# Patient Record
Sex: Male | Born: 1990 | Race: Black or African American | Hispanic: No | Marital: Single | Smoking: Never smoker
Health system: Southern US, Community
[De-identification: ages and names within clinical notes are randomized; demographics above are authoritative.]

---

## 2019-08-25 ENCOUNTER — Other Ambulatory Visit: Payer: Self-pay

## 2019-08-25 ENCOUNTER — Encounter: Payer: Self-pay | Admitting: Emergency Medicine

## 2019-08-25 ENCOUNTER — Emergency Department
Admission: EM | Admit: 2019-08-25 | Discharge: 2019-08-25 | Disposition: A | Discharge status: Home / Self Care | Payer: Self-pay | Attending: Emergency Medicine | Admitting: Emergency Medicine

## 2019-08-25 DIAGNOSIS — H1031 Unspecified acute conjunctivitis, right eye: Secondary | ICD-10-CM | POA: Insufficient documentation

## 2019-08-25 DIAGNOSIS — H5789 Other specified disorders of eye and adnexa: Secondary | ICD-10-CM | POA: Insufficient documentation

## 2019-08-25 MED ORDER — TOBRAMYCIN 0.3 % OP SOLN: 1.0000 [drp] | OPHTHALMIC | 0 refills | Status: AC

## 2019-08-25 MED ORDER — EYE WASH OPHTH SOLN
1.0000 [drp] | OPHTHALMIC | Status: DC | PRN
  Filled 2019-08-25: qty 118

## 2019-08-25 MED ORDER — TETRACAINE HCL 0.5 % OP SOLN
1.0000 [drp] | Freq: Once | OPHTHALMIC | Status: AC
  Administered 2019-08-25: 2 [drp] via OPHTHALMIC
  Filled 2019-08-25: qty 4

## 2019-08-25 MED ORDER — FLUORESCEIN SODIUM 1 MG OP STRP
1.0000 | ORAL_STRIP | Freq: Once | OPHTHALMIC | Status: AC
  Administered 2019-08-25: 1 via OPHTHALMIC
  Filled 2019-08-25: qty 1
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
## 2021-03-22 ENCOUNTER — Emergency Department: Payer: Self-pay

## 2021-03-22 ENCOUNTER — Emergency Department
Admission: EM | Admit: 2021-03-22 | Discharge: 2021-03-23 | Disposition: A | Discharge status: Home / Self Care | Payer: Self-pay | Attending: Emergency Medicine | Admitting: Emergency Medicine

## 2021-03-22 ENCOUNTER — Encounter: Payer: Self-pay | Admitting: Emergency Medicine

## 2021-03-22 ENCOUNTER — Other Ambulatory Visit: Payer: Self-pay

## 2021-03-22 DIAGNOSIS — R5081 Fever presenting with conditions classified elsewhere: Secondary | ICD-10-CM

## 2021-03-22 DIAGNOSIS — M545 Low back pain, unspecified: Secondary | ICD-10-CM | POA: Insufficient documentation

## 2021-03-22 DIAGNOSIS — R509 Fever, unspecified: Secondary | ICD-10-CM | POA: Insufficient documentation

## 2021-03-22 DIAGNOSIS — R4182 Altered mental status, unspecified: Secondary | ICD-10-CM | POA: Insufficient documentation

## 2021-03-22 DIAGNOSIS — M546 Pain in thoracic spine: Secondary | ICD-10-CM | POA: Insufficient documentation

## 2021-03-22 LAB — URINALYSIS, COMPLETE (UACMP) WITH MICROSCOPIC
Bacteria, UA: NONE SEEN
Bilirubin Urine: NEGATIVE
Glucose, UA: NEGATIVE mg/dL
Ketones, ur: 20 mg/dL — AB
Leukocytes,Ua: NEGATIVE
Nitrite: NEGATIVE
Protein, ur: NEGATIVE mg/dL
Specific Gravity, Urine: 1.019 (ref 1.005–1.030)
pH: 5 (ref 5.0–8.0)

## 2021-03-22 LAB — COMPREHENSIVE METABOLIC PANEL
ALT: 37 U/L (ref 0–44)
AST: 36 U/L (ref 15–41)
Albumin: 4.5 g/dL (ref 3.5–5.0)
Alkaline Phosphatase: 38 U/L (ref 38–126)
Anion gap: 10 (ref 5–15)
BUN: 14 mg/dL (ref 6–20)
CO2: 23 mmol/L (ref 22–32)
Calcium: 9 mg/dL (ref 8.9–10.3)
Chloride: 104 mmol/L (ref 98–111)
Creatinine, Ser: 1.29 mg/dL — ABNORMAL HIGH (ref 0.61–1.24)
GFR, Estimated: >60 mL/min (ref >60)
Glucose, Bld: 89 mg/dL (ref 70–99)
Potassium: 3.7 mmol/L (ref 3.5–5.1)
Sodium: 137 mmol/L (ref 135–145)
Total Bilirubin: 1.6 mg/dL — ABNORMAL HIGH (ref 0.3–1.2)
Total Protein: 7.7 g/dL (ref 6.5–8.1)

## 2021-03-22 LAB — CBC WITH DIFFERENTIAL/PLATELET
Abs Immature Granulocytes: 0.01 10*3/uL (ref 0.00–0.07)
Basophils Absolute: 0 10*3/uL (ref 0.0–0.1)
Basophils Relative: 1 %
Eosinophils Absolute: 0 10*3/uL (ref 0.0–0.5)
Eosinophils Relative: 1 %
HCT: 46.5 % (ref 39.0–52.0)
Hemoglobin: 16.2 g/dL (ref 13.0–17.0)
Immature Granulocytes: 0 %
Lymphocytes Relative: 16 %
Lymphs Abs: 0.6 10*3/uL — ABNORMAL LOW (ref 0.7–4.0)
MCH: 31.2 pg (ref 26.0–34.0)
MCHC: 34.8 g/dL (ref 30.0–36.0)
MCV: 89.4 fL (ref 80.0–100.0)
Monocytes Absolute: 0.7 10*3/uL (ref 0.1–1.0)
Monocytes Relative: 19 %
Neutro Abs: 2.4 10*3/uL (ref 1.7–7.7)
Neutrophils Relative %: 63 %
Platelets: 177 10*3/uL (ref 150–400)
RBC: 5.2 MIL/uL (ref 4.22–5.81)
RDW: 13.5 % (ref 11.5–15.5)
WBC: 3.8 10*3/uL — ABNORMAL LOW (ref 4.0–10.5)
nRBC: 0 % (ref 0.0–0.2)

## 2021-03-22 LAB — URINE DRUG SCREEN, QUALITATIVE (ARMC ONLY)
Amphetamines, Ur Screen: NOT DETECTED
Barbiturates, Ur Screen: NOT DETECTED
Benzodiazepine, Ur Scrn: NOT DETECTED
Cannabinoid 50 Ng, Ur ~~LOC~~: POSITIVE — AB
Cocaine Metabolite,Ur ~~LOC~~: NOT DETECTED
MDMA (Ecstasy)Ur Screen: NOT DETECTED
Methadone Scn, Ur: NOT DETECTED
Opiate, Ur Screen: NOT DETECTED
Phencyclidine (PCP) Ur S: NOT DETECTED
Tricyclic, Ur Screen: NOT DETECTED

## 2021-03-22 LAB — TROPONIN I (HIGH SENSITIVITY)
Troponin I (High Sensitivity): 2 ng/L (ref <18)
Troponin I (High Sensitivity): 3 ng/L (ref <18)

## 2021-03-22 LAB — MAGNESIUM: Magnesium: 1.9 mg/dL (ref 1.7–2.4)

## 2021-03-22 LAB — D-DIMER, QUANTITATIVE: D-Dimer, Quant: 0.75 ug/mL-FEU — ABNORMAL HIGH (ref 0.00–0.50)

## 2021-03-22 LAB — LACTIC ACID, PLASMA: Lactic Acid, Venous: 1.4 mmol/L (ref 0.5–1.9)

## 2021-03-22 MED ORDER — LACTATED RINGERS IV BOLUS
1000.0000 mL | Freq: Once | INTRAVENOUS | Status: AC
  Administered 2021-03-22: 1000 mL via INTRAVENOUS

## 2021-03-22 MED ORDER — GADOBUTROL 1 MMOL/ML IV SOLN
8.0000 mL | Freq: Once | INTRAVENOUS | Status: AC | PRN
  Administered 2021-03-22: 8 mL via INTRAVENOUS

## 2021-03-22 MED ORDER — ACETAMINOPHEN 500 MG PO TABS
1000.0000 mg | ORAL_TABLET | Freq: Once | ORAL | Status: AC
  Administered 2021-03-22: 1000 mg via ORAL
  Filled 2021-03-22: qty 2

## 2021-03-22 MED ORDER — KETOROLAC TROMETHAMINE 30 MG/ML IJ SOLN
15.0000 mg | Freq: Once | INTRAMUSCULAR | Status: AC
  Administered 2021-03-22: 15 mg via INTRAVENOUS
  Filled 2021-03-22: qty 1

## 2021-03-22 NOTE — ED Triage Notes (Signed)
Pt to ED via EMS from home. Per EMS, pt was alert to pain when they arrived on scene. Pt has not spoken as much. EMS gave pt ammonia to smell and that woke him up. Pt has temp 101.6 axillary.  Upon arrival pt is a/o x 4 and speaking in complete sentences with MD. Pt stated that he had taken some cough medicine and complains of back pain.

## 2021-03-22 NOTE — ED Provider Notes (Signed)
[facility] Emergency Department Provider Note ____________________________________________   Event Date/Time   First MD Initiated Contact with Patient 03/22/21 1708     (approximate)  I have reviewed the triage vital signs and the nursing notes.  HISTORY  Chief Complaint Altered Mental Status   HPI Alex Wu is a 30 y.o. malewho presents to the ED for evaluation of atraumatic thoracic back pain and altered mentation.  Chart review indicates no history in our system.  Patient presents to the ED via EMS from home due to altered mentation.  EMS was called by patient's mother or girlfriend to his home, found him in his room on his bed unresponsive with a bottle of NyQuil next to his bed.  Only small amount of this bottle was missing and there was no further paraphernalia or oddities to the scene.  Patient unresponsive and required ammonia salts to wake up, and has been awake since that time.  Patient reports atraumatic lumbar pain and fever for the past 24 hours.  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  Denies known fever, but is noted to have 101 F fever with EMS, and 103.4 F here. Patient indicates occasional cannabis use, but adamantly denies any IVDU.    Patient reports using 1 dose of NyQuil at home to help with his pain due to difficulty sleeping, without significant improvement.  Currently reporting 10/10 atraumatic lumbar pain to his upper lumbar back. Denies dysuria, chest pain, syncopal episodes, shortness of breath, emesis  No past medical history on file.  There are no problems to display for this patient.   No past surgical history on file.  Prior to Admission medications   Not on File    Allergies Patient has no known allergies.  No family history on file.  Social History Social  History   Tobacco Use  . Smoking status: Never Smoker  . Smokeless tobacco: Never Used  Substance Use Topics  . Alcohol use: Not Currently  . Drug use: Yes    Types: Marijuana    Comment: Pt stated he smokes everyday    Review of Systems  Constitutional: No fever/chills Eyes: No visual changes. ENT: No sore throat. Cardiovascular: Denies chest pain. Respiratory: Denies shortness of breath. Gastrointestinal: No abdominal pain.  No nausea, no vomiting.  No diarrhea.  No constipation. Genitourinary: Negative for dysuria. Musculoskeletal: Positive for atraumatic upper lumbar pain. Skin: Negative for rash. Neurological: Negative for headaches, focal weakness or numbness.  ____________________________________________   PHYSICAL EXAM:  VITAL SIGNS: Vitals:   03/22/21 2030 03/22/21 2100  BP: 116/79 119/78  Pulse: 85 75  Resp: (!) 22 (!) 22  Temp:    SpO2: 95% 95%     Constitutional: Alert and oriented. Well appearing and in no acute distress.  Supine in bed, slow to respond, but answers all questions appropriately. Able to independently roll on his side so I can examine his back. Eyes: Conjunctivae are normal. PERRL. EOMI. Head: Atraumatic. Nose: No congestion/rhinnorhea. Mouth/Throat: Mucous membranes are moist.  Oropharynx non-erythematous. Neck: No stridor. No cervical spine tenderness to palpation. Cardiovascular: Normal rate, regular rhythm. Grossly normal heart sounds.  Good peripheral circulation. Respiratory: Normal respiratory effort.  No retractions. Lungs CTAB. Gastrointestinal: Soft , nondistended, nontender to palpation. No CVA tenderness. Musculoskeletal: No lower extremity tenderness nor edema.  No joint effusions. No signs of acute  trauma. Upper lumbar pain around L1/L2 that is poorly localizing.  No signs of trauma to the back or bony step-offs.  Some tenderness paraspinally, but primarily around midline. Neurologic:  Normal speech and language. No gross  focal neurologic deficits are appreciated.  Cranial nerves II through XII intact 5/5 strength and sensation in all 4 extremities Skin:  Skin is warm, dry and intact. No rash noted. Psychiatric: Mood and affect are normal. Speech and behavior are normal.  ____________________________________________   LABS (all labs ordered are listed, but only abnormal results are displayed)  Labs Reviewed  CBC WITH DIFFERENTIAL/PLATELET - Abnormal; Notable for the following components:      Result Value   WBC 3.8 (*)    Lymphs Abs 0.6 (*)    All other components within normal limits  URINALYSIS, COMPLETE (UACMP) WITH MICROSCOPIC - Abnormal; Notable for the following components:   Color, Urine YELLOW (*)    APPearance CLEAR (*)    Hgb urine dipstick SMALL (*)    Ketones, ur 20 (*)    All other components within normal limits  URINE DRUG SCREEN, QUALITATIVE ([facility] ONLY) - Abnormal; Notable for the following components:   Cannabinoid 50 Ng, Ur [place] POSITIVE (*)    All other components within normal limits  COMPREHENSIVE METABOLIC PANEL - Abnormal; Notable for the following components:   Creatinine, Ser 1.29 (*)    Total Bilirubin 1.6 (*)    All other components within normal limits  D-DIMER, QUANTITATIVE (NOT AT [facility]) - Abnormal; Notable for the following components:   D-Dimer, Quant 0.75 (*)    All other components within normal limits  CULTURE, BLOOD (SINGLE)  LACTIC ACID, PLASMA  MAGNESIUM  LACTIC ACID, PLASMA  TROPONIN I (HIGH SENSITIVITY)  TROPONIN I (HIGH SENSITIVITY)   ____________________________________________  12 Lead EKG  Sinus rhythm, rate of 97 bpm.  Normal axis and intervals.  Inferior T wave inversions without STEMI criteria.  Some stigmata of LVH ____________________________________________  RADIOLOGY  ED MD interpretation: 1 view CXR reviewed by me without evidence of acute cardiopulmonary pathology.  Official radiology report(s):  DG Chest Portable 1 View  Result  Date: 03/22/2021 CLINICAL DATA:  Altered mental status, fever and pleuritic pain. EXAM: PORTABLE CHEST 1 VIEW COMPARISON:  None. FINDINGS: Lungs clear. Heart size normal. No pneumothorax or pleural fluid. No bony abnormality. IMPRESSION: Normal chest. Electronically Signed   By: Drusilla Kanner M.D.   On: 03/22/2021 17:36    ____________________________________________   PROCEDURES and INTERVENTIONS  Procedure(s) performed (including Critical Care):  .1-3 Lead EKG Interpretation Performed by: Delton Prairie, MD Authorized by: Delton Prairie, MD     Interpretation: normal     ECG rate:  74   ECG rate assessment: normal     Rhythm: sinus rhythm     Ectopy: none     Conduction: normal      Medications  lactated ringers bolus 1,000 mL (1,000 mLs Intravenous New Bag/Given 03/22/21 1721)  acetaminophen (TYLENOL) tablet 1,000 mg (1,000 mg Oral Given 03/22/21 1940)  ketorolac (TORADOL) 30 MG/ML injection 15 mg (15 mg Intravenous Given 03/22/21 1939)  gadobutrol (GADAVIST) 1 MMOL/ML injection 8 mL (8 mLs Intravenous Contrast Given 03/22/21 2257)    ____________________________________________   MDM / ED COURSE   Otherwise healthy 30 year old male presents to the ED with atraumatic back pain and fever of uncertain etiology.  Febrile at 103.4 F, but hemodynamically stable.  Only complaint is back pain, he did not even know that he had a  fever.  Reports pain started last night in an atraumatic fashion and has been progressively worsening.  Adamantly denies any IVDU and only indicates cannabis ingestion.  Blood work is reassuring without evidence of sepsis or severe metabolic derangements.  CXR without infiltrate.  Provided antipyretics and pursued MRI lumbar spine to evaluate for spinal epidural abscess.  No neurologic or vascular deficits.  Patient signed out to oncoming provider pending read of this MRI.  Clinical Course as of 03/22/21 2323  Wed Mar 22, 2021  1906 Reassessed.  Patient  continues to have significant tenderness to his upper lumbar back.  We discussed fever and back pain and is concerning for possible spinal pathology, such as spinal epidural abscess.  He continues to deny IVDU and only indicates he smokes cannabis.  I recommend MRI of his lumbar back and he is agreeable. [DS]  2322 Patient signed out to oncoming provider pending MRI lumbar spine read.  We discussed CTA imaging if this is negative.  [DS]    Clinical Course User Index [DS] Delton Prairie, MD    ____________________________________________   FINAL CLINICAL IMPRESSION(S) / ED DIAGNOSES  Final diagnoses:  Fever in other diseases  Acute midline low back pain without sciatica     ED Discharge Orders    None       Leota Maka   Note:  This document was prepared using Dragon voice recognition software and may include unintentional dictation errors.   Delton Prairie, MD 03/22/21 [phone]

## 2021-03-23 LAB — LACTIC ACID, PLASMA: Lactic Acid, Venous: 0.9 mmol/L (ref 0.5–1.9)

## 2021-03-23 LAB — SALICYLATE LEVEL: Salicylate Lvl: <7 mg/dL — ABNORMAL LOW (ref 7.0–30.0)

## 2021-03-23 LAB — ACETAMINOPHEN LEVEL: Acetaminophen (Tylenol), Serum: <10 ug/mL — ABNORMAL LOW (ref 10–30)

## 2021-03-23 MED ORDER — IOHEXOL 350 MG/ML SOLN
75.0000 mL | Freq: Once | INTRAVENOUS | Status: AC | PRN
  Administered 2021-03-23: 75 mL via INTRAVENOUS

## 2021-03-23 NOTE — ED Provider Notes (Signed)
Accepted care of this patient from Dr. Adaline Sill at 11 PM pending CT.  This is an otherwise healthy 30 year old male who presented for evaluation of fever and confusion.  Patient was also complaining of back pain.  Upon my reevaluation patient reports that he has had a history of back pain in the past due to his job.  He reports that he carries heavy things and walks up and down the stairs several times a day for work.  He reports that the pain was worse yesterday.  He was found with a bottle of NyQuil next to him.  He does not even remember taking any NyQuil.  He is diffusely tender on the upper lumbar area including bilateral paraspinal spaces.  I reviewed the MRI with the radiologist Dr. Deatra Robinson over the phone and there is no abnormality seen.  Patient was then sent for CT chest abdomen pelvis which were negative for PE, negative for any intra-abdominal abnormalities.  Also reviewed the CT with Dr. Alcide Clever from radiology who does not see any abnormalities of the spine or any other abnormalities on patient's CT.   I did add a salicylate and acetaminophen level due to concerns of possible NyQuil overdose.  They were both undetectable.  Lactic is negative.  Mild leukopenia with a white count of 3.8 with low lymphocytes.  Possibly a viral syndrome causing the fever.  His UA is negative for urinary tract infection.  Patient received Tylenol, Toradol and fluids per Dr. Katrinka Blazing.  He feels markedly improved.  He is here with his significant other who says patient is at baseline at this time.  At this point with a negative work-up patient is stable to discharge home on supportive care.  Discussed my standard return precautions and close follow-up with PCP   Nita Sickle, MD 03/23/21 [phone]

## 2021-03-27 LAB — CULTURE, BLOOD (SINGLE): Culture: NO GROWTH

## 2021-09-04 IMAGING — CT CT ABD-PELV W/ CM
2 of 4 series · 15 of 46 positions shown, 17 images · IV contrast (APPLIED)
Comparison: None.

CLINICAL DATA: Found unresponsive

EXAM:
CT ANGIOGRAPHY CHEST
CT ABDOMEN AND PELVIS WITH CONTRAST
TECHNIQUE: Multidetector CT imaging of the chest was performed using the
standard protocol during bolus administration of intravenous
contrast. Multiplanar CT image reconstructions and MIPs were
obtained to evaluate the vascular anatomy. Multidetector CT imaging
of the abdomen and pelvis was performed using the standard protocol
during bolus administration of intravenous contrast.
CONTRAST:  75mL OMNIPAQUE IOHEXOL 350 MG/ML SOLN

[Series 2: axial st · axial · 0.79mm/px · z∈[-668,-208]mm · 12 of 110 slices shown, 14 images]
[im 9/110  soft-tissue]
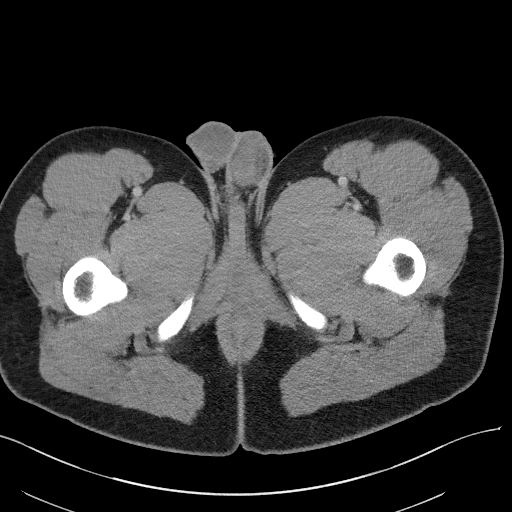
[im 9/110  bone]
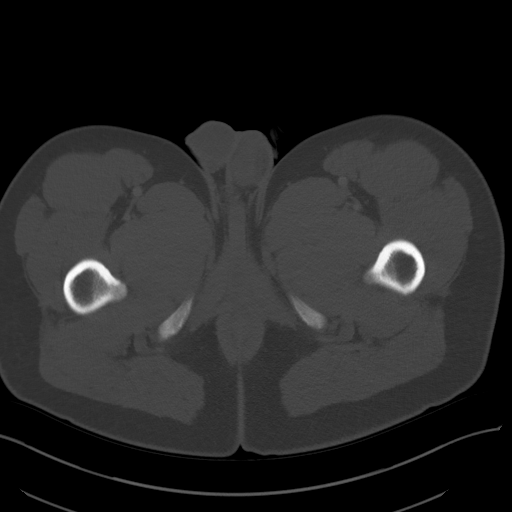
[im 17/110  soft-tissue]
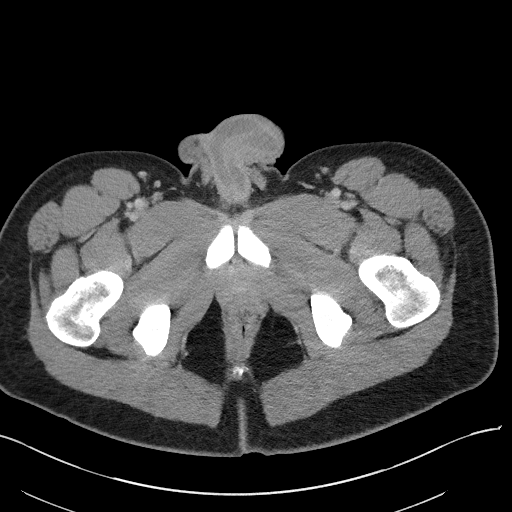
[im 25/110  soft-tissue]
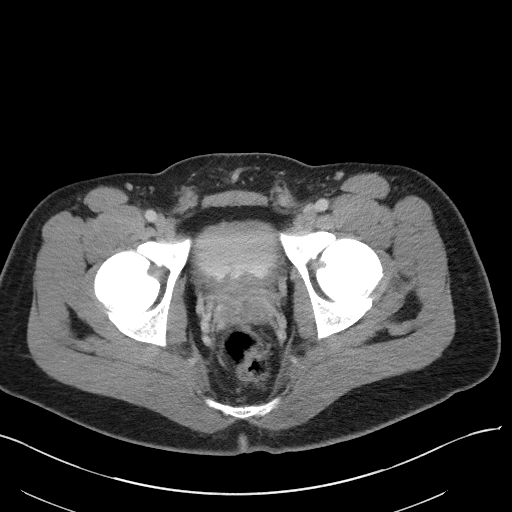
[im 33/110  soft-tissue]
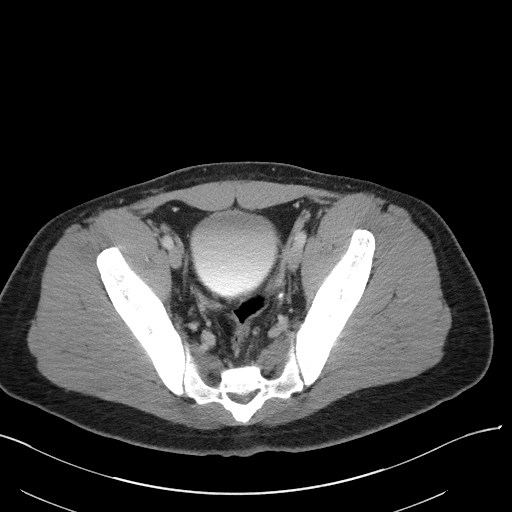
[im 41/110  soft-tissue]
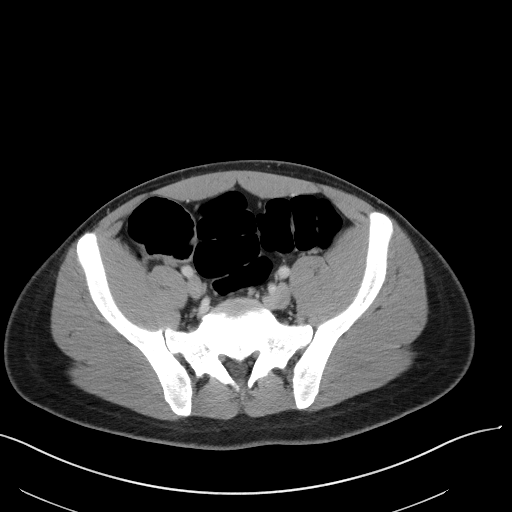
[im 49/110  soft-tissue]
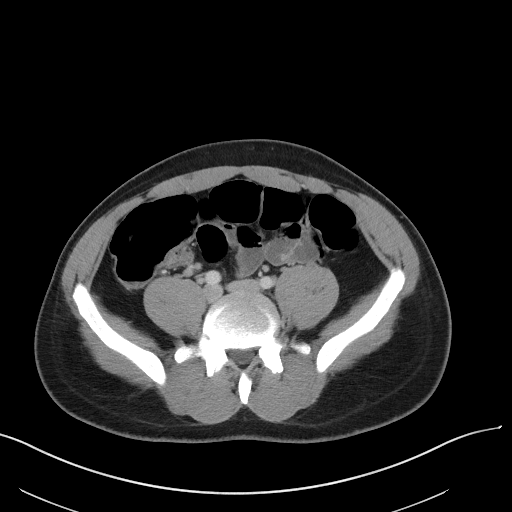
[im 61/110  soft-tissue]
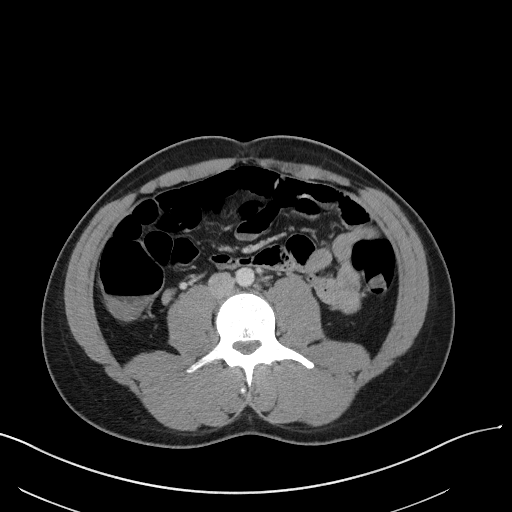
[im 69/110  soft-tissue]
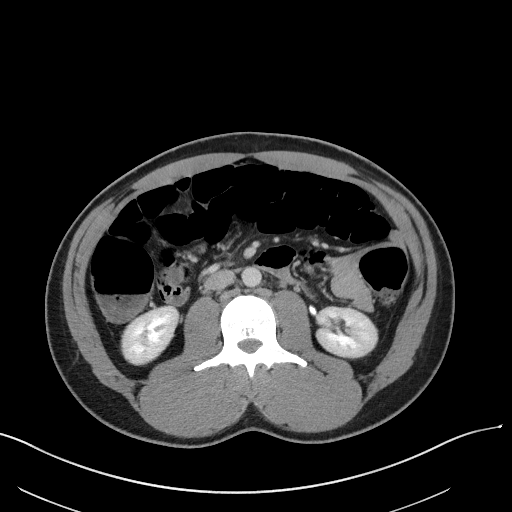
[im 77/110  soft-tissue]
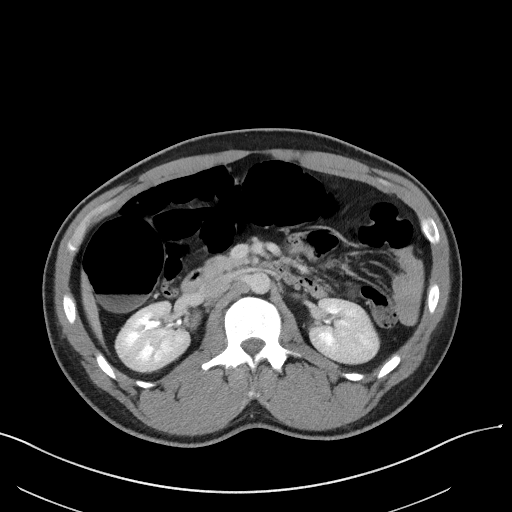
[im 77/110  bone]
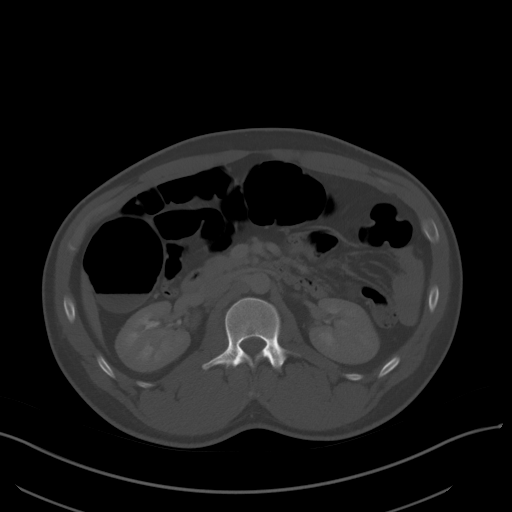
[im 85/110  soft-tissue]
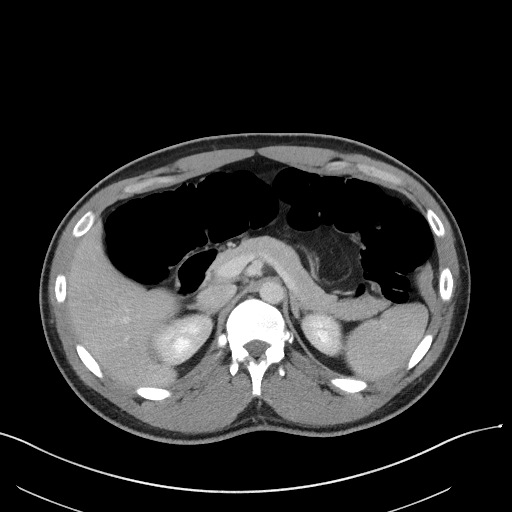
[im 93/110  soft-tissue]
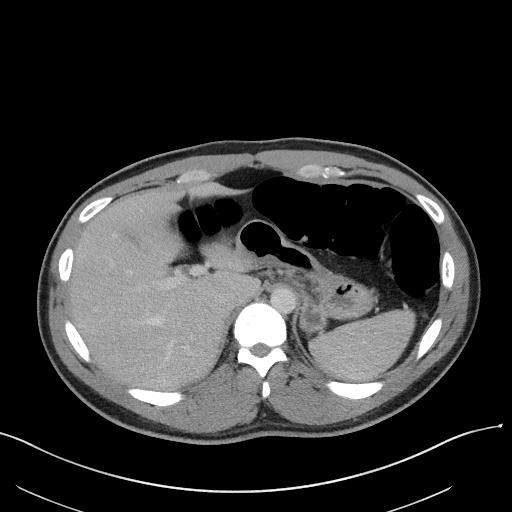
[im 101/110  soft-tissue]
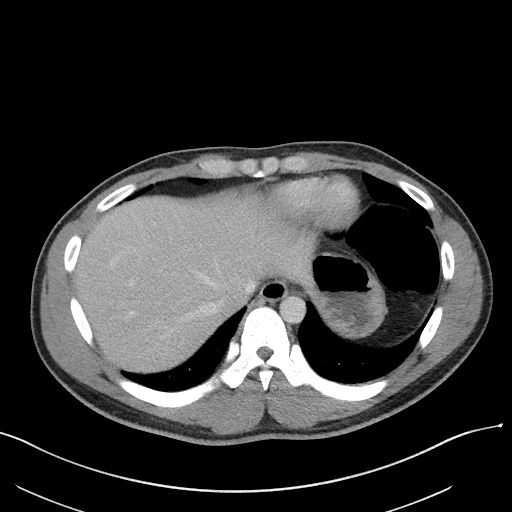

[Series 5: coronal st · coronal · 0.83mm/px · 3 of 94 slices shown]
[im 32/94  soft-tissue]
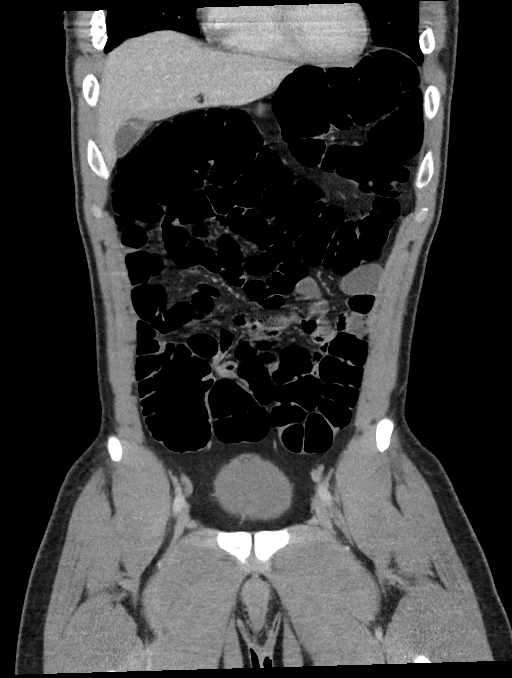
[im 42/94  soft-tissue]
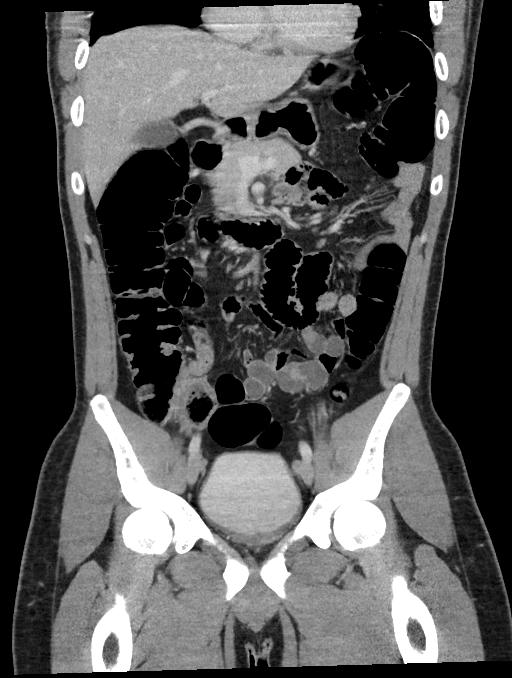
[im 52/94  soft-tissue]
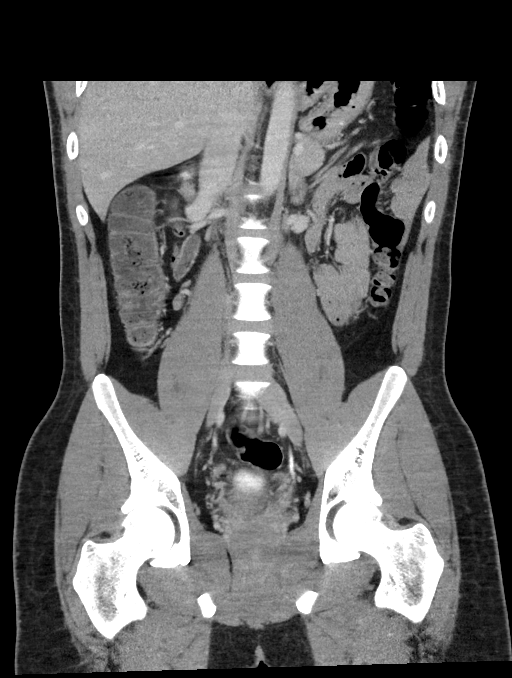

[15 of 46 positions shown; findings below may reference images not displayed]

FINDINGS: CTA CHEST FINDINGS

Cardiovascular: Thoracic aorta and its branches are within normal
limits. No aneurysmal dilatation or dissection is seen. Heart is not
significantly enlarged. No coronary calcifications are noted. The
pulmonary artery shows a normal branching pattern bilaterally. No
filling defects to suggest pulmonary emboli are noted.

Mediastinum/Nodes: Thoracic inlet is within normal limits. No
sizable hilar or mediastinal adenopathy is noted. The esophagus as
visualized is within normal limits.

Lungs/Pleura: Lungs are well aerated bilaterally. No focal
infiltrate or sizable effusion is seen.

Musculoskeletal: No chest wall abnormality. No acute or significant
osseous findings.

Review of the MIP images confirms the above findings.

CT ABDOMEN and PELVIS FINDINGS

Hepatobiliary: No focal liver abnormality is seen. No gallstones,
gallbladder wall thickening, or biliary dilatation.

Pancreas: Unremarkable. No pancreatic ductal dilatation or
surrounding inflammatory changes.

Spleen: Normal in size without focal abnormality.

Adrenals/Urinary Tract: Adrenal glands are within normal limits.
Kidneys demonstrate a normal enhancement pattern and normal
excretion bilaterally. Bladder is partially distended.

Stomach/Bowel: Air is noted throughout the colon without obstructive
changes. The appendix is within normal limits. No small bowel or
gastric abnormality is noted.

Vascular/Lymphatic: No significant vascular findings are present. No
enlarged abdominal or pelvic lymph nodes.

Reproductive: Prostate is unremarkable.

Other: No abdominal wall hernia or abnormality. No abdominopelvic
ascites.

Musculoskeletal: No acute or significant osseous findings.

Review of the MIP images confirms the above findings.
IMPRESSION: CTA of the chest: No evidence of pulmonary emboli.

No aortic abnormality is noted.

No acute abnormality is seen.

CT of the abdomen and pelvis: No acute abnormality noted.

## 2024-01-06 ENCOUNTER — Emergency Department
Admission: EM | Admit: 2024-01-06 | Discharge: 2024-01-06 | Disposition: A | Discharge status: Home / Self Care | Payer: Self-pay | Attending: Emergency Medicine | Admitting: Emergency Medicine

## 2024-01-06 ENCOUNTER — Other Ambulatory Visit: Payer: Self-pay

## 2024-01-06 ENCOUNTER — Encounter: Payer: Self-pay | Admitting: Emergency Medicine

## 2024-01-06 DIAGNOSIS — J101 Influenza due to other identified influenza virus with other respiratory manifestations: Secondary | ICD-10-CM | POA: Insufficient documentation

## 2024-01-06 DIAGNOSIS — Z20822 Contact with and (suspected) exposure to covid-19: Secondary | ICD-10-CM | POA: Insufficient documentation

## 2024-01-06 LAB — RESP PANEL BY RT-PCR (RSV, FLU A&B, COVID)  RVPGX2
Influenza A by PCR: POSITIVE — AB
Influenza B by PCR: NEGATIVE
Resp Syncytial Virus by PCR: NEGATIVE
SARS Coronavirus 2 by RT PCR: NEGATIVE

## 2024-01-06 NOTE — ED Provider Notes (Signed)
[facility] Provider Note    Event Date/Time   First MD Initiated Contact with Patient 01/06/24 1846     (approximate)   History   Headache, Cough, and Generalized Body Aches   HPI  Alex Wu is a 33 y.o. male with no significant past medical history presents emergency department complaining of headache, body aches and cough since Friday.  No vomiting or diarrhea.  States just started feeling really bad when at work today.  Will need a work note.      Physical Exam   Triage Vital Signs: ED Triage Vitals  Encounter Vitals Group     BP 01/06/24 1658 106/89     Systolic BP Percentile --      Diastolic BP Percentile --      Pulse Rate 01/06/24 1658 (!) 109     Resp 01/06/24 1658 15     Temp 01/06/24 1658 100 F (37.8 C)     Temp Source 01/06/24 1658 Oral     SpO2 01/06/24 1658 98 %     Weight 01/06/24 1659 240 lb (108.9 kg)     Height 01/06/24 1659 6\' 1"  (1.854 m)     Head Circumference --      Peak Flow --      Pain Score 01/06/24 1659 10     Pain Loc --      Pain Education --      Exclude from Growth Chart --     Most recent vital signs: Vitals:   01/06/24 1658  BP: 106/89  Pulse: (!) 109  Resp: 15  Temp: 100 F (37.8 C)  SpO2: 98%     General: Awake, no distress.   CV:  Good peripheral perfusion. regular rate and  rhythm Resp:  Normal effort.  Abd:  No distention.   Other:      ED Results / Procedures / Treatments   Labs (all labs ordered are listed, but only abnormal results are displayed) Labs Reviewed  RESP PANEL BY RT-PCR (RSV, FLU A&B, COVID)  RVPGX2 - Abnormal; Notable for the following components:      Result Value   Influenza A by PCR POSITIVE (*)    All other components within normal limits     EKG     RADIOLOGY     PROCEDURES:   Procedures Chief Complaint  Patient presents with   Headache   Cough   Generalized Body Aches      MEDICATIONS ORDERED IN ED: Medications - No data to  display   IMPRESSION / MDM / ASSESSMENT AND PLAN / ED COURSE  I reviewed the triage vital signs and the nursing notes.                              Differential diagnosis includes, but is not limited to, COVID, influenza, RSV, viral URI  Patient's presentation is most consistent with acute illness / injury with system symptoms.   Respiratory panel positive for influenza A  And explained findings to patient.  He will be given a work note.  Over-the-counter measures discussed.  Return precautions discussed.  He is in agreement with treatment plan.  Discharged stable condition.      FINAL CLINICAL IMPRESSION(S) / ED DIAGNOSES   Final diagnoses:  Influenza A     Rx / DC Orders   ED Discharge Orders     None  Note:  This document was prepared using Dragon voice recognition software and may include unintentional dictation errors.    Faythe Ghee, PA-C 01/06/24 Marcy Salvo, MD 01/06/24 [phone]

## 2024-01-06 NOTE — Discharge Instructions (Signed)
Drink plenty of fluids, take Tylenol and ibuprofen for fever/pain.  TheraFlu might help.  Over-the-counter cough medicines if needed.

## 2024-01-06 NOTE — ED Triage Notes (Signed)
Pt in via POV, complaints of headache, body aches and cough since Friday, febrile in triage.  Ambulatory to triage, NAD noted at this time.
